# Patient Record
Sex: Female | Born: 1960 | Race: White | Hispanic: No | Marital: Married | Smoking: Never smoker
Health system: Southern US, Community
[De-identification: ages and names within clinical notes are randomized; demographics above are authoritative.]

## PROBLEM LIST (undated history)

## (undated) DIAGNOSIS — Q782 Osteopetrosis: Secondary | ICD-10-CM

## (undated) DIAGNOSIS — E079 Disorder of thyroid, unspecified: Secondary | ICD-10-CM

---

## 2001-02-13 ENCOUNTER — Other Ambulatory Visit: Admission: RE | Admit: 2001-02-13 | Discharge: 2001-02-13 | Payer: Self-pay | Admitting: Obstetrics and Gynecology

## 2001-02-17 ENCOUNTER — Encounter: Admission: RE | Admit: 2001-02-17 | Discharge: 2001-02-17 | Payer: Self-pay | Admitting: Obstetrics and Gynecology

## 2001-02-17 ENCOUNTER — Encounter: Payer: Self-pay | Admitting: Obstetrics and Gynecology

## 2002-02-22 ENCOUNTER — Other Ambulatory Visit: Admission: RE | Admit: 2002-02-22 | Discharge: 2002-02-22 | Payer: Self-pay | Admitting: Obstetrics and Gynecology

## 2003-04-06 ENCOUNTER — Other Ambulatory Visit: Admission: RE | Admit: 2003-04-06 | Discharge: 2003-04-06 | Payer: Self-pay | Admitting: Obstetrics and Gynecology

## 2003-04-08 ENCOUNTER — Encounter: Payer: Self-pay | Admitting: Obstetrics and Gynecology

## 2003-04-08 ENCOUNTER — Ambulatory Visit (HOSPITAL_COMMUNITY): Admission: RE | Admit: 2003-04-08 | Discharge: 2003-04-08 | Payer: Self-pay | Admitting: Obstetrics and Gynecology

## 2003-07-19 ENCOUNTER — Ambulatory Visit (HOSPITAL_COMMUNITY): Admission: RE | Admit: 2003-07-19 | Discharge: 2003-07-19 | Payer: Self-pay | Admitting: Family Medicine

## 2003-07-19 ENCOUNTER — Encounter: Payer: Self-pay | Admitting: Family Medicine

## 2004-06-13 ENCOUNTER — Other Ambulatory Visit: Admission: RE | Admit: 2004-06-13 | Discharge: 2004-06-13 | Payer: Self-pay | Admitting: Obstetrics and Gynecology

## 2005-09-05 ENCOUNTER — Other Ambulatory Visit: Admission: RE | Admit: 2005-09-05 | Discharge: 2005-09-05 | Payer: Self-pay | Admitting: Obstetrics and Gynecology

## 2005-09-11 ENCOUNTER — Encounter: Admission: RE | Admit: 2005-09-11 | Discharge: 2005-09-11 | Payer: Self-pay | Admitting: Obstetrics and Gynecology

## 2005-12-10 ENCOUNTER — Other Ambulatory Visit: Admission: RE | Admit: 2005-12-10 | Discharge: 2005-12-10 | Payer: Self-pay | Admitting: Obstetrics and Gynecology

## 2006-04-15 ENCOUNTER — Encounter: Admission: RE | Admit: 2006-04-15 | Discharge: 2006-04-15 | Payer: Self-pay | Admitting: Obstetrics and Gynecology

## 2006-09-10 ENCOUNTER — Encounter: Admission: RE | Admit: 2006-09-10 | Discharge: 2006-09-10 | Payer: Self-pay | Admitting: Obstetrics and Gynecology

## 2007-10-14 ENCOUNTER — Encounter: Admission: RE | Admit: 2007-10-14 | Discharge: 2007-10-14 | Payer: Self-pay | Admitting: Obstetrics and Gynecology

## 2009-01-20 ENCOUNTER — Encounter: Admission: RE | Admit: 2009-01-20 | Discharge: 2009-01-20 | Payer: Self-pay | Admitting: Obstetrics and Gynecology

## 2010-02-16 ENCOUNTER — Encounter: Admission: RE | Admit: 2010-02-16 | Discharge: 2010-02-16 | Payer: Self-pay | Admitting: Obstetrics and Gynecology

## 2010-10-27 ENCOUNTER — Encounter: Payer: Self-pay | Admitting: Obstetrics and Gynecology

## 2011-02-27 ENCOUNTER — Other Ambulatory Visit: Payer: Self-pay | Admitting: Obstetrics and Gynecology

## 2011-02-27 DIAGNOSIS — Z1231 Encounter for screening mammogram for malignant neoplasm of breast: Secondary | ICD-10-CM

## 2011-03-22 ENCOUNTER — Ambulatory Visit
Admission: RE | Admit: 2011-03-22 | Discharge: 2011-03-22 | Disposition: A | Discharge status: Home / Self Care | Payer: BC Managed Care – PPO | Source: Ambulatory Visit | Attending: Obstetrics and Gynecology | Admitting: Obstetrics and Gynecology

## 2011-03-22 DIAGNOSIS — Z1231 Encounter for screening mammogram for malignant neoplasm of breast: Secondary | ICD-10-CM

## 2012-02-11 ENCOUNTER — Other Ambulatory Visit: Payer: Self-pay | Admitting: Obstetrics and Gynecology

## 2012-02-11 DIAGNOSIS — Z1231 Encounter for screening mammogram for malignant neoplasm of breast: Secondary | ICD-10-CM

## 2012-03-27 ENCOUNTER — Ambulatory Visit
Admission: RE | Admit: 2012-03-27 | Discharge: 2012-03-27 | Disposition: A | Discharge status: Home / Self Care | Payer: BC Managed Care – PPO | Source: Ambulatory Visit | Attending: Obstetrics and Gynecology | Admitting: Obstetrics and Gynecology

## 2012-03-27 DIAGNOSIS — Z1231 Encounter for screening mammogram for malignant neoplasm of breast: Secondary | ICD-10-CM

## 2013-04-06 ENCOUNTER — Other Ambulatory Visit: Payer: Self-pay

## 2013-04-06 DIAGNOSIS — Z1231 Encounter for screening mammogram for malignant neoplasm of breast: Secondary | ICD-10-CM

## 2013-04-23 ENCOUNTER — Ambulatory Visit
Admission: RE | Admit: 2013-04-23 | Discharge: 2013-04-23 | Disposition: A | Discharge status: Home / Self Care | Payer: BC Managed Care – PPO | Source: Ambulatory Visit

## 2013-04-23 DIAGNOSIS — Z1231 Encounter for screening mammogram for malignant neoplasm of breast: Secondary | ICD-10-CM

## 2013-04-27 ENCOUNTER — Other Ambulatory Visit: Payer: Self-pay | Admitting: Obstetrics and Gynecology

## 2013-04-27 DIAGNOSIS — R928 Other abnormal and inconclusive findings on diagnostic imaging of breast: Secondary | ICD-10-CM

## 2013-05-17 ENCOUNTER — Ambulatory Visit
Admission: RE | Admit: 2013-05-17 | Discharge: 2013-05-17 | Disposition: A | Discharge status: Home / Self Care | Payer: BC Managed Care – PPO | Source: Ambulatory Visit | Attending: Obstetrics and Gynecology | Admitting: Obstetrics and Gynecology

## 2013-05-17 DIAGNOSIS — R928 Other abnormal and inconclusive findings on diagnostic imaging of breast: Secondary | ICD-10-CM

## 2014-03-17 ENCOUNTER — Other Ambulatory Visit: Payer: Self-pay | Admitting: Obstetrics and Gynecology

## 2014-03-17 DIAGNOSIS — N6002 Solitary cyst of left breast: Secondary | ICD-10-CM

## 2014-04-29 ENCOUNTER — Ambulatory Visit
Admission: RE | Admit: 2014-04-29 | Discharge: 2014-04-29 | Disposition: A | Discharge status: Home / Self Care | Payer: BC Managed Care – PPO | Source: Ambulatory Visit | Attending: Obstetrics and Gynecology | Admitting: Obstetrics and Gynecology

## 2014-04-29 DIAGNOSIS — N6002 Solitary cyst of left breast: Secondary | ICD-10-CM

## 2015-06-02 ENCOUNTER — Other Ambulatory Visit: Payer: Self-pay

## 2015-06-02 DIAGNOSIS — Z1231 Encounter for screening mammogram for malignant neoplasm of breast: Secondary | ICD-10-CM

## 2015-07-14 ENCOUNTER — Ambulatory Visit
Admission: RE | Admit: 2015-07-14 | Discharge: 2015-07-14 | Disposition: A | Discharge status: Home / Self Care | Payer: BC Managed Care – PPO | Source: Ambulatory Visit

## 2015-07-14 DIAGNOSIS — Z1231 Encounter for screening mammogram for malignant neoplasm of breast: Secondary | ICD-10-CM

## 2016-07-24 ENCOUNTER — Other Ambulatory Visit: Payer: Self-pay | Admitting: Obstetrics and Gynecology

## 2016-07-24 DIAGNOSIS — Z1231 Encounter for screening mammogram for malignant neoplasm of breast: Secondary | ICD-10-CM

## 2016-08-16 ENCOUNTER — Ambulatory Visit
Admission: RE | Admit: 2016-08-16 | Discharge: 2016-08-16 | Disposition: A | Discharge status: Home / Self Care | Payer: BC Managed Care – PPO | Source: Ambulatory Visit | Attending: Obstetrics and Gynecology | Admitting: Obstetrics and Gynecology

## 2016-08-16 DIAGNOSIS — Z1231 Encounter for screening mammogram for malignant neoplasm of breast: Secondary | ICD-10-CM

## 2017-07-22 ENCOUNTER — Other Ambulatory Visit: Payer: Self-pay | Admitting: Obstetrics and Gynecology

## 2017-07-22 DIAGNOSIS — Z1231 Encounter for screening mammogram for malignant neoplasm of breast: Secondary | ICD-10-CM

## 2017-08-18 ENCOUNTER — Ambulatory Visit
Admission: RE | Admit: 2017-08-18 | Discharge: 2017-08-18 | Disposition: A | Discharge status: Home / Self Care | Payer: BC Managed Care – PPO | Source: Ambulatory Visit | Attending: Obstetrics and Gynecology | Admitting: Obstetrics and Gynecology

## 2017-08-18 DIAGNOSIS — Z1231 Encounter for screening mammogram for malignant neoplasm of breast: Secondary | ICD-10-CM

## 2018-08-07 ENCOUNTER — Other Ambulatory Visit: Payer: Self-pay | Admitting: Obstetrics and Gynecology

## 2018-08-07 DIAGNOSIS — Z1231 Encounter for screening mammogram for malignant neoplasm of breast: Secondary | ICD-10-CM

## 2018-09-21 ENCOUNTER — Ambulatory Visit
Admission: RE | Admit: 2018-09-21 | Discharge: 2018-09-21 | Disposition: A | Discharge status: Home / Self Care | Payer: BC Managed Care – PPO | Source: Ambulatory Visit | Attending: Obstetrics and Gynecology | Admitting: Obstetrics and Gynecology

## 2018-09-21 DIAGNOSIS — Z1231 Encounter for screening mammogram for malignant neoplasm of breast: Secondary | ICD-10-CM

## 2019-07-14 ENCOUNTER — Other Ambulatory Visit: Payer: Self-pay | Admitting: Obstetrics and Gynecology

## 2019-07-14 DIAGNOSIS — Z1231 Encounter for screening mammogram for malignant neoplasm of breast: Secondary | ICD-10-CM

## 2019-09-09 ENCOUNTER — Other Ambulatory Visit: Payer: Self-pay

## 2019-09-09 ENCOUNTER — Ambulatory Visit (INDEPENDENT_AMBULATORY_CARE_PROVIDER_SITE_OTHER): Payer: BC Managed Care – PPO

## 2019-09-09 ENCOUNTER — Ambulatory Visit
Admission: EM | Admit: 2019-09-09 | Discharge: 2019-09-09 | Disposition: A | Discharge status: Home / Self Care | Payer: BC Managed Care – PPO

## 2019-09-09 DIAGNOSIS — R0789 Other chest pain: Secondary | ICD-10-CM | POA: Diagnosis not present

## 2019-09-09 DIAGNOSIS — S0992XA Unspecified injury of nose, initial encounter: Secondary | ICD-10-CM

## 2019-09-09 DIAGNOSIS — S00511A Abrasion of lip, initial encounter: Secondary | ICD-10-CM

## 2019-09-09 DIAGNOSIS — J3489 Other specified disorders of nose and nasal sinuses: Secondary | ICD-10-CM

## 2019-09-09 MED ORDER — CYCLOBENZAPRINE HCL 5 MG PO TABS: 5.0000 mg | ORAL_TABLET | Freq: Every day | ORAL | 0 refills | Status: DC

## 2019-09-09 MED ORDER — NAPROXEN 375 MG PO TABS: 375.0000 mg | ORAL_TABLET | Freq: Two times a day (BID) | ORAL | 0 refills | Status: DC

## 2019-09-09 NOTE — Discharge Instructions (Signed)
X-rays negative for fracture or dislocation Rest, ice and heat as needed Ensure adequate range of motion as tolerated. Injuries all appear to be muscular in nature at this time Prescribed naproxen as needed for inflammation and pain relief.  DO NOT TAKE WITH OTHER antiinflammatories, as this may cause GI upset and/or bleed Prescribed flexeril as needed at bedtime for muscle spasm.  Do not drive or operate heavy machinery while taking this medication Expect some increased pain in the next 1-3 days.  It may take 3-4 weeks for complete resolution of symptoms Will f/u with her doctor or here if not seeing significant improvement within one week. Return here or go to ER if you have any new or worsening symptoms such as numbness/tingling of the inner thighs, loss of bladder or bowel control, headache/blurry vision, nausea/vomiting, confusion/altered mental status, dizziness, weakness, passing out, imbalance, etc... 
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
## 2019-09-28 ENCOUNTER — Other Ambulatory Visit: Payer: Self-pay

## 2019-09-28 ENCOUNTER — Ambulatory Visit
Admission: RE | Admit: 2019-09-28 | Discharge: 2019-09-28 | Disposition: A | Discharge status: Home / Self Care | Payer: BC Managed Care – PPO | Source: Ambulatory Visit | Attending: Obstetrics and Gynecology | Admitting: Obstetrics and Gynecology

## 2019-09-28 DIAGNOSIS — Z1231 Encounter for screening mammogram for malignant neoplasm of breast: Secondary | ICD-10-CM

## 2020-08-17 ENCOUNTER — Other Ambulatory Visit: Payer: Self-pay | Admitting: Obstetrics and Gynecology

## 2020-08-17 DIAGNOSIS — Z1231 Encounter for screening mammogram for malignant neoplasm of breast: Secondary | ICD-10-CM

## 2020-10-04 ENCOUNTER — Ambulatory Visit: Payer: BC Managed Care – PPO

## 2020-11-10 ENCOUNTER — Ambulatory Visit
Admission: RE | Admit: 2020-11-10 | Discharge: 2020-11-10 | Disposition: A | Discharge status: Home / Self Care | Payer: Self-pay | Source: Ambulatory Visit | Attending: Obstetrics and Gynecology | Admitting: Obstetrics and Gynecology

## 2020-11-10 ENCOUNTER — Other Ambulatory Visit: Payer: Self-pay

## 2020-11-10 DIAGNOSIS — Z1231 Encounter for screening mammogram for malignant neoplasm of breast: Secondary | ICD-10-CM

## 2021-08-07 ENCOUNTER — Other Ambulatory Visit: Payer: Self-pay | Admitting: Obstetrics and Gynecology

## 2021-08-07 DIAGNOSIS — Z1231 Encounter for screening mammogram for malignant neoplasm of breast: Secondary | ICD-10-CM

## 2021-11-29 ENCOUNTER — Other Ambulatory Visit: Payer: Self-pay

## 2021-11-29 ENCOUNTER — Ambulatory Visit
Admission: RE | Admit: 2021-11-29 | Discharge: 2021-11-29 | Disposition: A | Discharge status: Home / Self Care | Payer: BLUE CROSS/BLUE SHIELD | Source: Ambulatory Visit | Attending: Obstetrics and Gynecology | Admitting: Obstetrics and Gynecology

## 2021-11-29 DIAGNOSIS — Z1231 Encounter for screening mammogram for malignant neoplasm of breast: Secondary | ICD-10-CM

## 2021-12-25 ENCOUNTER — Other Ambulatory Visit: Payer: Self-pay | Admitting: Obstetrics and Gynecology

## 2021-12-25 DIAGNOSIS — N6320 Unspecified lump in the left breast, unspecified quadrant: Secondary | ICD-10-CM

## 2022-01-10 ENCOUNTER — Ambulatory Visit
Admission: RE | Admit: 2022-01-10 | Discharge: 2022-01-10 | Disposition: A | Discharge status: Home / Self Care | Payer: BLUE CROSS/BLUE SHIELD | Source: Ambulatory Visit | Attending: Obstetrics and Gynecology | Admitting: Obstetrics and Gynecology

## 2022-01-10 DIAGNOSIS — N6320 Unspecified lump in the left breast, unspecified quadrant: Secondary | ICD-10-CM

## 2023-08-02 ENCOUNTER — Ambulatory Visit
Admission: EM | Admit: 2023-08-02 | Discharge: 2023-08-02 | Disposition: A | Discharge status: Home / Self Care | Payer: BC Managed Care – PPO

## 2023-08-02 ENCOUNTER — Encounter: Payer: Self-pay | Admitting: Emergency Medicine

## 2023-08-02 DIAGNOSIS — J301 Allergic rhinitis due to pollen: Secondary | ICD-10-CM

## 2023-08-02 HISTORY — DX: Disorder of thyroid, unspecified: E07.9

## 2023-08-02 HISTORY — DX: Osteopetrosis: Q78.2

## 2023-08-02 LAB — POCT RAPID STREP A (OFFICE): Rapid Strep A Screen: NEGATIVE

## 2023-08-02 MED ORDER — BENZONATATE 100 MG PO CAPS: 100.0000 mg | ORAL_CAPSULE | Freq: Three times a day (TID) | ORAL | 0 refills | Status: AC

## 2023-08-02 NOTE — ED Triage Notes (Signed)
Sore throat since Tuesday.  Headache that comes and goes.  Occasional cough.

## 2023-08-02 NOTE — ED Triage Notes (Signed)
Home covid test this morning was negative

## 2023-08-02 NOTE — Discharge Instructions (Signed)
Your symptoms are likely due to environmental allergies.  Avoid exposure to allergens. Take oral antihistamine (Either Zyrtec, Claritin, or Allegra) and use Flonase daily as directed. You can buy these medications over the counter. These medications can take a few days to fully kick in to your body and start working. Return for any new or worsening symptoms.  If your eyes become itchy, you may purchase olopatadine (Pataday) eyedrops over the counter and use as directed to relieve watery/itchy eyes associated with allergies as well.   If your symptoms are severe, please go to the emergency room for further evaluation. Schedule an appointment with your primary care provider for follow-up and further management of your seasonal allergies as well as ongoing preventive healthcare.

## 2023-08-02 NOTE — ED Provider Notes (Signed)
[facility]    CSN: [id number] Arrival date & time: 08/02/23  1058      History   Chief Complaint Chief Complaint  Patient presents with   Sore Throat    HPI Linda Kim is a 62 y.o. female.   Linda Kim is a 62 y.o. female presenting for chief complaint of Sore Throat that started 4 to 5 days ago with associated intermittent generalized headache and throat clearing cough.  Cough is nonproductive and she states it is triggered by throat clearing sensation to the back of her throat.  Throat pain is worsened by swallowing and sometimes improves after ibuprofen or Tylenol use.  Fevers, chills, body aches, ear pain, sneezing, watery/itchy eyes, rash, nausea, vomiting, diarrhea, abdominal pain.  She does not have a history of seasonal allergies and does not currently take any medications for allergies.  No history of chronic respiratory problems.  No recent sick contacts with similar symptoms.  Never smoker, denies drug use.  Has been taking Tylenol and ibuprofen over-the-counter with with some temporary relief.   Sore Throat    Past Medical History:  Diagnosis Date   Osteopetrosis    Thyroid disease     There are no problems to display for this patient.   History reviewed. No pertinent surgical history.  OB History   No obstetric history on file.      Home Medications    Prior to Admission medications   Medication Sig Start Date End Date Taking? Authorizing Provider  benzonatate (TESSALON) 100 MG capsule Take 1 capsule (100 mg total) by mouth every 8 (eight) hours. 08/02/23  Yes Carlisle Beers, FNP  denosumab (PROLIA) 60 MG/ML SOSY injection Inject 60 mg into the skin every 6 (six) months.   Yes [provider]  calcium carbonate (OS-CAL - DOSED IN MG OF ELEMENTAL CALCIUM) 1250 (500 Ca) MG tablet Take 1 tablet by mouth.    [provider]  cholecalciferol (VITAMIN D3) 25 MCG (1000 UT) tablet Take 1,000 Units by mouth daily.     [provider]  levothyroxine (SYNTHROID) 50 MCG tablet Take 50 mcg by mouth daily before breakfast.    [provider]    Family History Family History  Problem Relation Age of Onset   Breast cancer Maternal Aunt        unsure of age   Healthy Mother     Social History Social History   Tobacco Use   Smoking status: Never   Smokeless tobacco: Never  Vaping Use   Vaping status: Never Used  Substance Use Topics   Alcohol use: Never   Drug use: Never     Allergies   Fosamax [alendronate]   Review of Systems Review of Systems Per HPI  Physical Exam Triage Vital Signs ED Triage Vitals [08/02/23 1202]  Encounter Vitals Group     BP (!) 124/55     Systolic BP Percentile      Diastolic BP Percentile      Pulse Rate 76     Resp 18     Temp 98.3 F (36.8 C)     Temp Source Oral     SpO2 96 %     Weight      Height      Head Circumference      Peak Flow      Pain Score 8     Pain Loc      Pain Education  Exclude from Growth Chart    No data found.  Updated Vital Signs BP (!) 124/55 (BP Location: Right Arm)   Pulse 76   Temp 98.3 F (36.8 C) (Oral)   Resp 18   SpO2 96%   Visual Acuity Right Eye Distance:   Left Eye Distance:   Bilateral Distance:    Right Eye Near:   Left Eye Near:    Bilateral Near:     Physical Exam Vitals and nursing note reviewed.  Constitutional:      Appearance: She is not ill-appearing or toxic-appearing.  HENT:     Head: Normocephalic and atraumatic.     Right Ear: Hearing, tympanic membrane, ear canal and external ear normal.     Left Ear: Hearing, tympanic membrane, ear canal and external ear normal.     Nose: Nose normal.     Mouth/Throat:     Lips: Pink.     Mouth: Mucous membranes are moist. No injury.     Tongue: No lesions. Tongue does not deviate from midline.     Palate: No mass and lesions.     Pharynx: Oropharynx is clear. Uvula midline. Posterior oropharyngeal erythema present. No  pharyngeal swelling, oropharyngeal exudate or uvula swelling.     Tonsils: No tonsillar exudate or tonsillar abscesses.     Comments: Mild erythema to posterior oropharynx with small amount of clear postnasal drainage visualized. Eyes:     General: Lids are normal. Vision grossly intact. Gaze aligned appropriately.     Extraocular Movements: Extraocular movements intact.     Conjunctiva/sclera: Conjunctivae normal.  Cardiovascular:     Rate and Rhythm: Normal rate and regular rhythm.     Heart sounds: Normal heart sounds, S1 normal and S2 normal.  Pulmonary:     Effort: Pulmonary effort is normal. No respiratory distress.     Breath sounds: Normal breath sounds and air entry.  Musculoskeletal:     Cervical back: Neck supple.  Skin:    General: Skin is warm and dry.     Capillary Refill: Capillary refill takes less than 2 seconds.     Findings: No rash.  Neurological:     General: No focal deficit present.     Mental Status: She is alert and oriented to person, place, and time. Mental status is at baseline.     Cranial Nerves: No dysarthria or facial asymmetry.  Psychiatric:        Mood and Affect: Mood normal.        Speech: Speech normal.        Behavior: Behavior normal.        Thought Content: Thought content normal.        Judgment: Judgment normal.      UC Treatments / Results  Labs (all labs ordered are listed, but only abnormal results are displayed) Labs Reviewed  POCT RAPID STREP A (OFFICE)    EKG   Radiology No results found.  Procedures Procedures (including critical care time)  Medications Ordered in UC Medications - No data to display  Initial Impression / Assessment and Plan / UC Course  I have reviewed the triage vital signs and the nursing notes.  Pertinent labs & imaging results that were available during my care of the patient were reviewed by me and considered in my medical decision making (see chart for details).   1. Seasonal allergic  rhinitis Allergic rhinitis will be treated with daily antihistamine and Flonase nasal spray. May use tessalon perles for  cough. Low suspicion for viral URI etiology, no indication for viral testing. Lungs clear, therefore deferred imaging. Vital signs stable.  Counseled patient on potential for adverse effects with medications prescribed/recommended today, strict ER and return-to-clinic precautions discussed, patient verbalized understanding.    Final Clinical Impressions(s) / UC Diagnoses   Final diagnoses:  Seasonal allergic rhinitis due to pollen     Discharge Instructions      Your symptoms are likely due to environmental allergies.  Avoid exposure to allergens. Take oral antihistamine (Either Zyrtec, Claritin, or Allegra) and use Flonase daily as directed. You can buy these medications over the counter. These medications can take a few days to fully kick in to your body and start working. Return for any new or worsening symptoms.  If your eyes become itchy, you may purchase olopatadine (Pataday) eyedrops over the counter and use as directed to relieve watery/itchy eyes associated with allergies as well.   If your symptoms are severe, please go to the emergency room for further evaluation. Schedule an appointment with your primary care provider for follow-up and further management of your seasonal allergies as well as ongoing preventive healthcare.    ED Prescriptions     Medication Sig Dispense Auth. Provider   benzonatate (TESSALON) 100 MG capsule Take 1 capsule (100 mg total) by mouth every 8 (eight) hours. 21 capsule Carlisle Beers, FNP      PDMP not reviewed this encounter.   Carlisle Beers, [place] 08/02/23 1238

## 2023-11-01 IMAGING — US US BREAST*L* LIMITED INC AXILLA
1 series · 2 of 2 positions shown · non-contrast
Comparison: 11/29/2021 and earlier

CLINICAL DATA: Palpable mass noted in the 6 o'clock location on
recent physical exam by provider.

EXAM:
DIGITAL DIAGNOSTIC UNILATERAL LEFT MAMMOGRAM WITH TOMOSYNTHESIS AND
CAD; ULTRASOUND LEFT BREAST LIMITED
TECHNIQUE: Left digital diagnostic mammography and breast tomosynthesis was
performed. The images were evaluated with computer-aided detection.;
Targeted ultrasound examination of the left breast was performed.

[Series 1: us breast*left* limited inc axilla · 0.09mm/px · 2 of 2 slices shown]
[im 1/2]
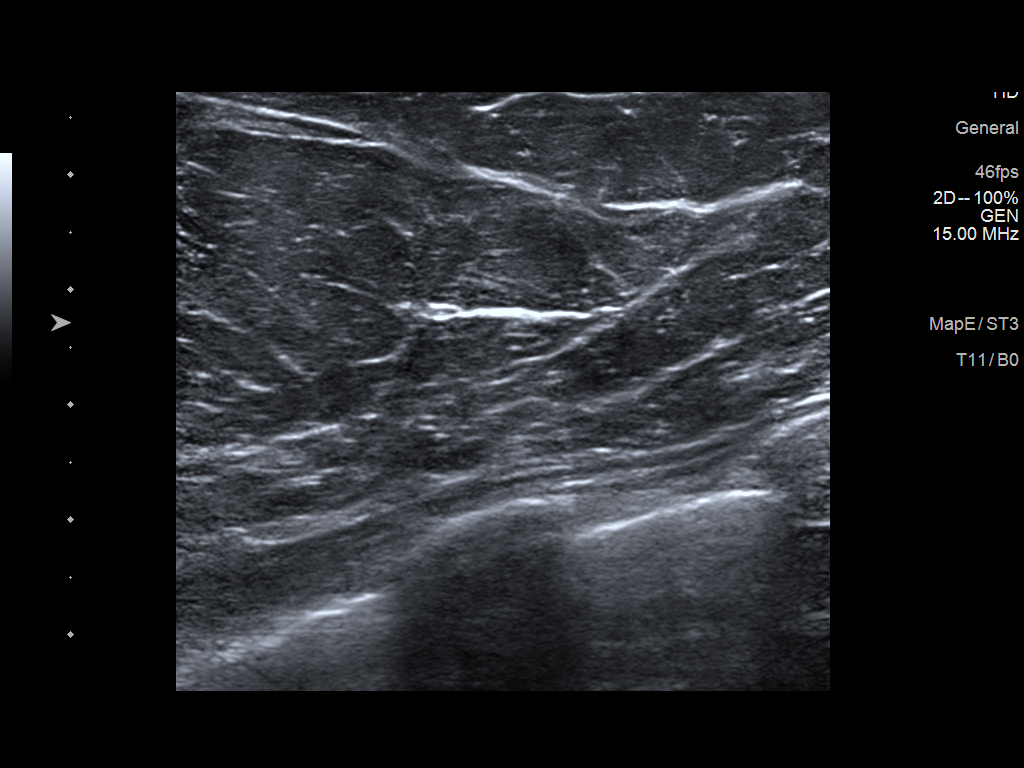
[im 2/2]
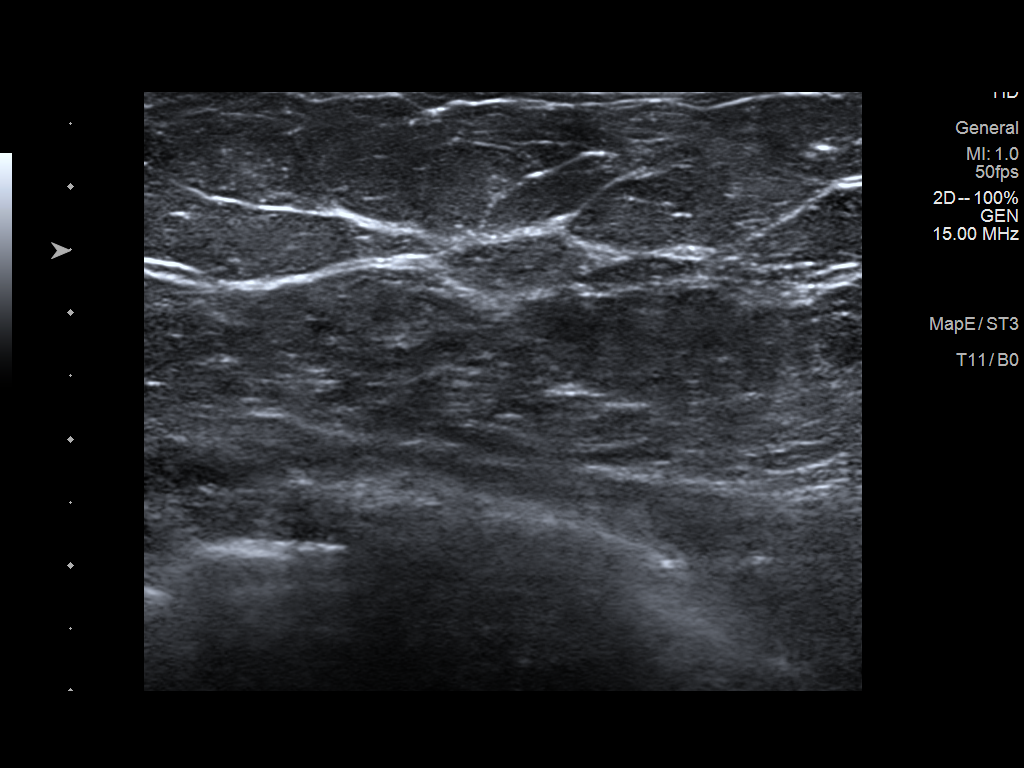

[2 of 2 positions shown; findings below may reference images not displayed]

ACR Breast Density Category b: There are scattered areas of
fibroglandular density.
FINDINGS: No suspicious mass, distortion, or microcalcifications are
identified to suggest presence of malignancy.

On physical exam, I palpate soft nodularity along the inframammary
fold of the LEFT breast. I palpate no discrete mass in this region.

Targeted ultrasound is performed, showing normal appearing
fibrofatty tissue throughout the inframammary fold region of the
LEFT breast. No suspicious mass, distortion, or acoustic shadowing
is demonstrated with ultrasound.
IMPRESSION: No mammographic or ultrasound evidence for malignancy.

RECOMMENDATION:
Screening mammogram is recommended in November 2022.

I have discussed the findings and recommendations with the patient.
If applicable, a reminder letter will be sent to the patient
regarding the next appointment.

BI-RADS CATEGORY  1: Negative.

## 2023-11-01 IMAGING — MG MM DIGITAL DIAGNOSTIC UNILAT*L* W/ TOMO W/ CAD
4 series · 4 of 12 positions shown · non-contrast
Comparison: 11/29/2021 and earlier

CLINICAL DATA: Palpable mass noted in the 6 o'clock location on
recent physical exam by provider.

EXAM:
DIGITAL DIAGNOSTIC UNILATERAL LEFT MAMMOGRAM WITH TOMOSYNTHESIS AND
CAD; ULTRASOUND LEFT BREAST LIMITED
TECHNIQUE: Left digital diagnostic mammography and breast tomosynthesis was
performed. The images were evaluated with computer-aided detection.;
Targeted ultrasound examination of the left breast was performed.

[L CC synth-2D]
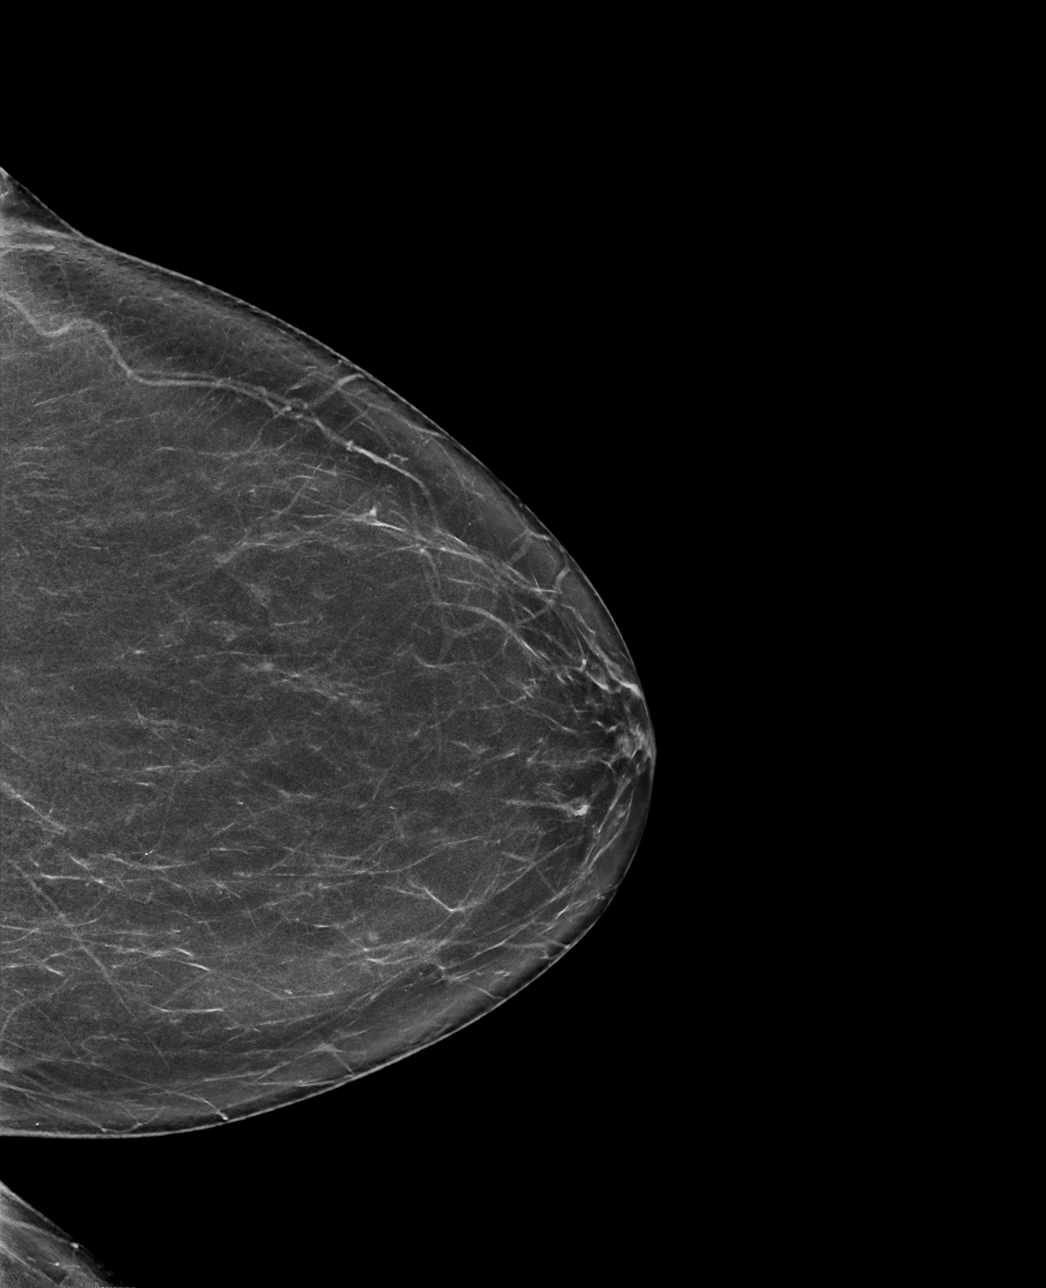

[L MLO synth-2D]
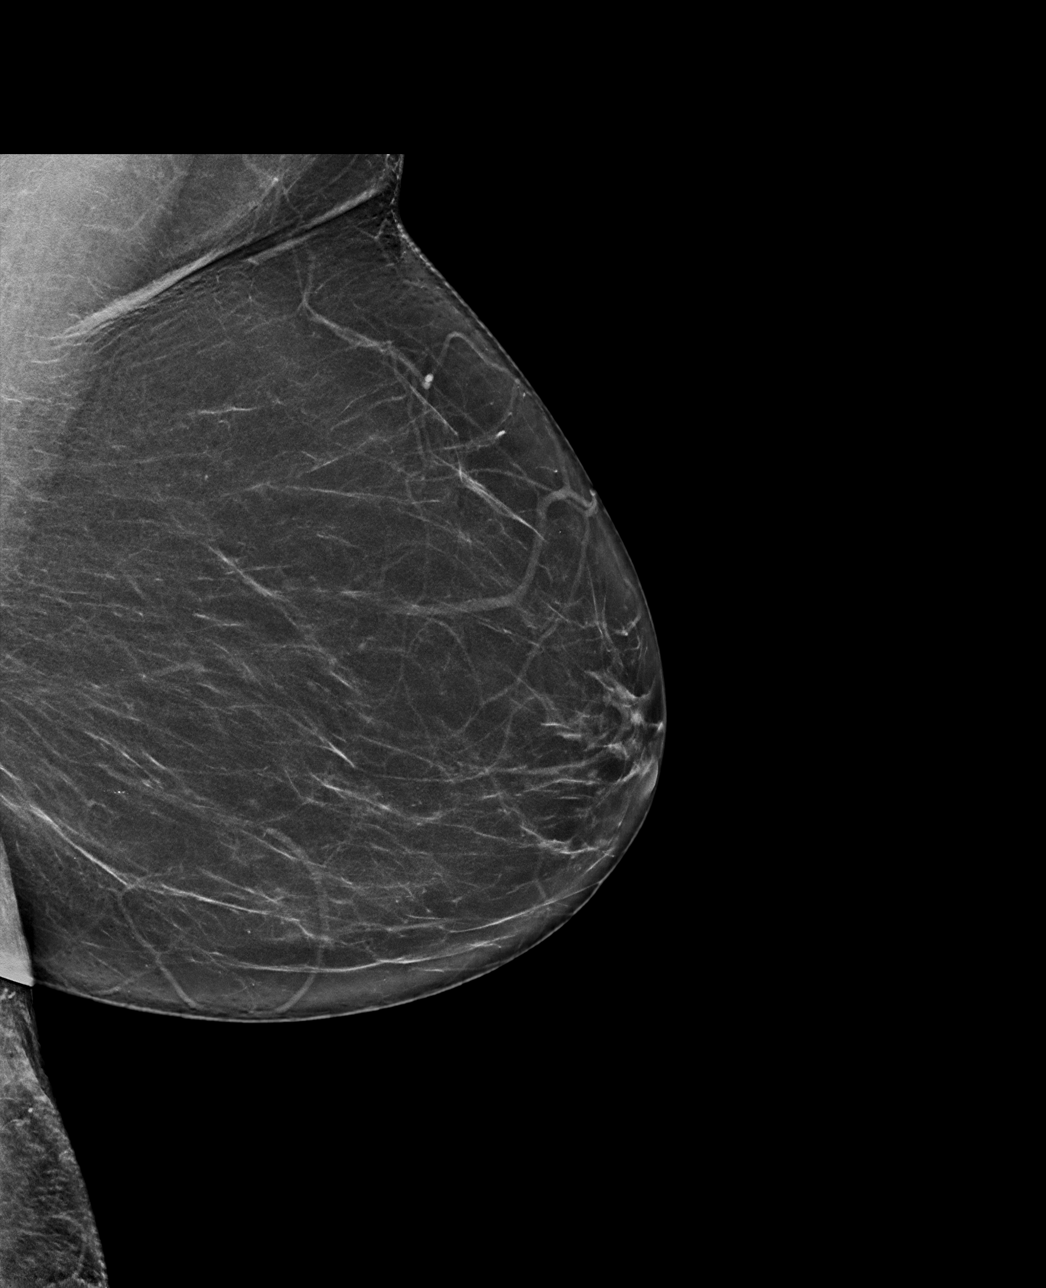

[L MLO tomo · tomo slice 35/70.0]
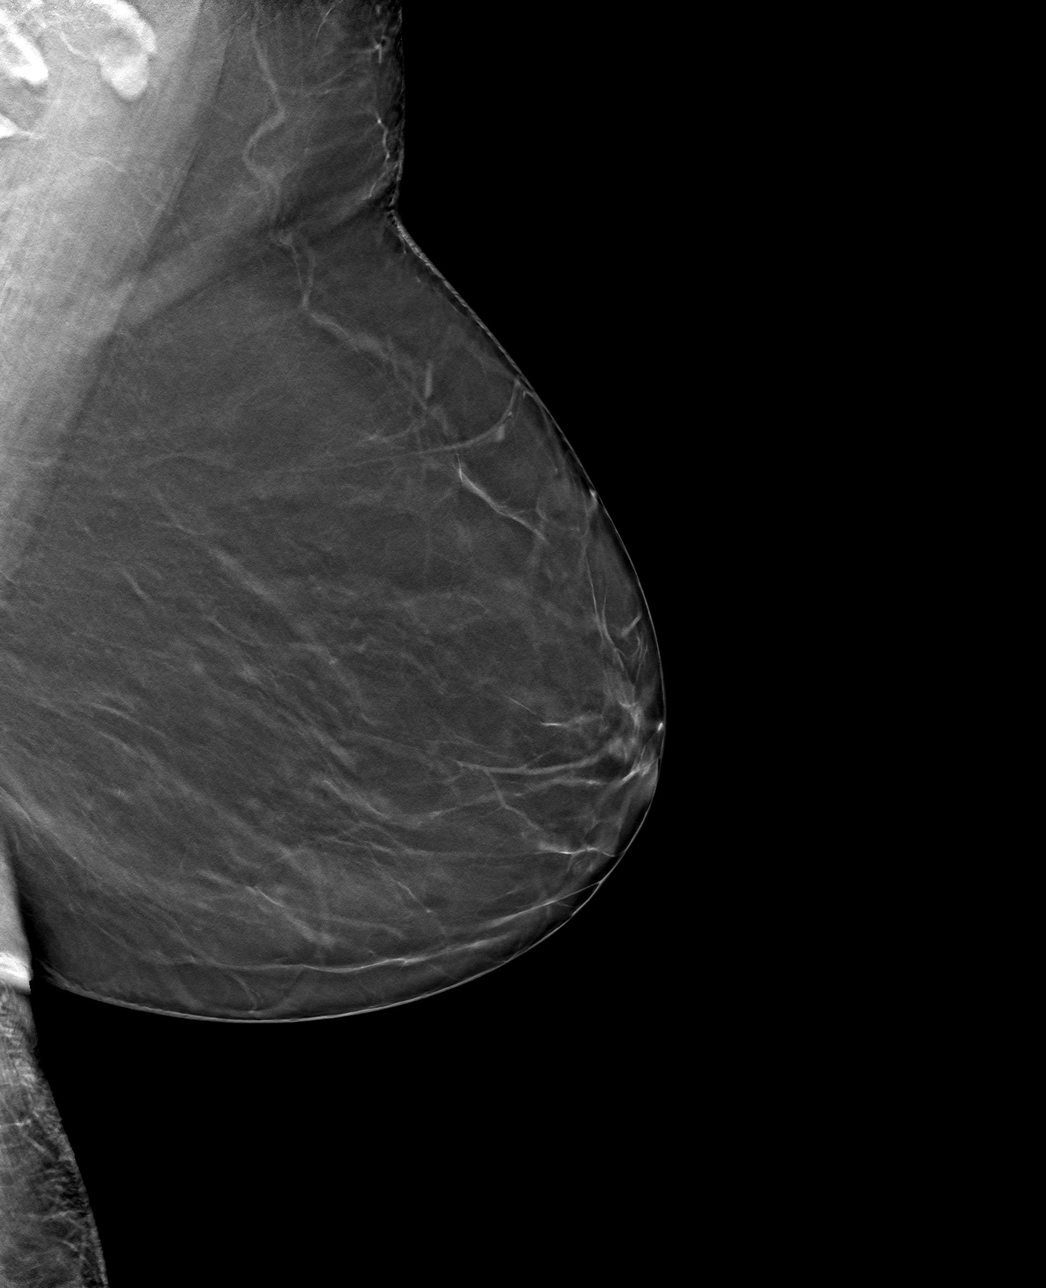

[L CC tomo · tomo slice 35/70.0]
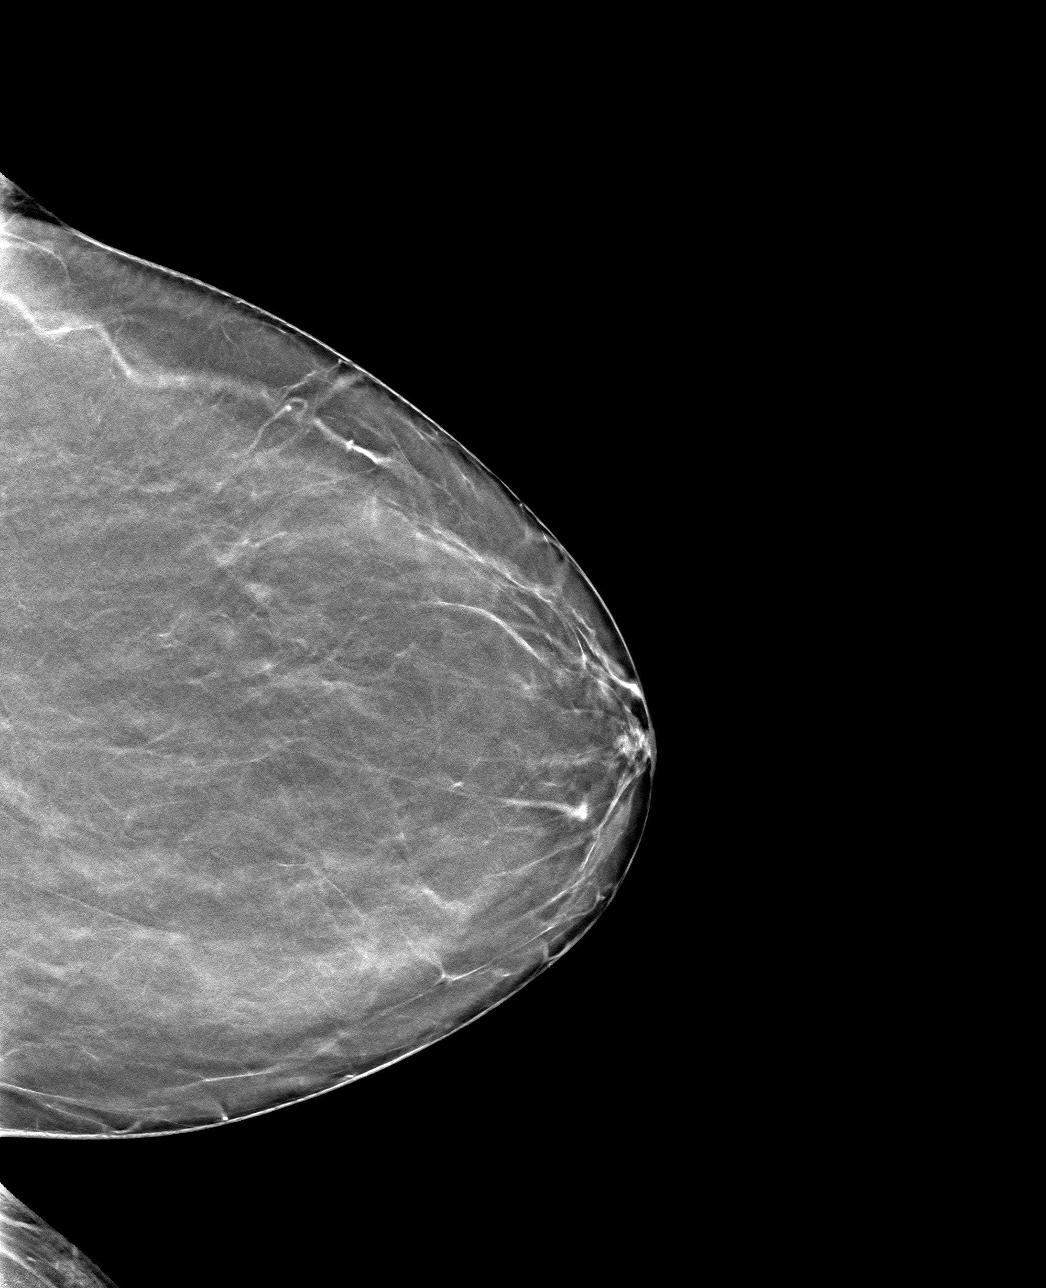

[4 of 12 positions shown; findings below may reference images not displayed]

ACR Breast Density Category b: There are scattered areas of
fibroglandular density.
FINDINGS: No suspicious mass, distortion, or microcalcifications are
identified to suggest presence of malignancy.

On physical exam, I palpate soft nodularity along the inframammary
fold of the LEFT breast. I palpate no discrete mass in this region.

Targeted ultrasound is performed, showing normal appearing
fibrofatty tissue throughout the inframammary fold region of the
LEFT breast. No suspicious mass, distortion, or acoustic shadowing
is demonstrated with ultrasound.
IMPRESSION: No mammographic or ultrasound evidence for malignancy.

RECOMMENDATION:
Screening mammogram is recommended in November 2022.

I have discussed the findings and recommendations with the patient.
If applicable, a reminder letter will be sent to the patient
regarding the next appointment.

BI-RADS CATEGORY  1: Negative.

## 2023-12-04 ENCOUNTER — Other Ambulatory Visit: Payer: Self-pay | Admitting: Obstetrics and Gynecology

## 2023-12-04 DIAGNOSIS — Z1231 Encounter for screening mammogram for malignant neoplasm of breast: Secondary | ICD-10-CM

## 2023-12-11 ENCOUNTER — Ambulatory Visit
Admission: RE | Admit: 2023-12-11 | Discharge: 2023-12-11 | Disposition: A | Discharge status: Home / Self Care | Payer: BC Managed Care – PPO | Source: Ambulatory Visit | Attending: Obstetrics and Gynecology | Admitting: Obstetrics and Gynecology

## 2023-12-11 DIAGNOSIS — Z1231 Encounter for screening mammogram for malignant neoplasm of breast: Secondary | ICD-10-CM

## 2024-03-10 ENCOUNTER — Other Ambulatory Visit (HOSPITAL_COMMUNITY): Payer: Self-pay | Admitting: Obstetrics and Gynecology

## 2024-03-10 DIAGNOSIS — E7800 Pure hypercholesterolemia, unspecified: Secondary | ICD-10-CM

## 2024-03-26 ENCOUNTER — Ambulatory Visit (HOSPITAL_COMMUNITY)
Admission: RE | Admit: 2024-03-26 | Discharge: 2024-03-26 | Disposition: A | Discharge status: Home / Self Care | Source: Ambulatory Visit | Attending: Obstetrics and Gynecology | Admitting: Obstetrics and Gynecology

## 2024-03-26 ENCOUNTER — Encounter (HOSPITAL_COMMUNITY): Payer: Self-pay

## 2024-03-26 DIAGNOSIS — E7800 Pure hypercholesterolemia, unspecified: Secondary | ICD-10-CM | POA: Insufficient documentation

## 2024-04-02 ENCOUNTER — Encounter: Payer: Self-pay | Admitting: Internal Medicine

## 2024-04-02 ENCOUNTER — Ambulatory Visit: Attending: Internal Medicine | Admitting: Internal Medicine

## 2024-04-02 VITALS — BP 114/62 | HR 62 | Ht 61.0 in | Wt 141.4 lb

## 2024-04-02 DIAGNOSIS — I515 Myocardial degeneration: Secondary | ICD-10-CM | POA: Diagnosis not present

## 2024-04-02 DIAGNOSIS — Z131 Encounter for screening for diabetes mellitus: Secondary | ICD-10-CM

## 2024-04-02 DIAGNOSIS — Z13220 Encounter for screening for lipoid disorders: Secondary | ICD-10-CM

## 2024-04-02 MED ORDER — ROSUVASTATIN CALCIUM 10 MG PO TABS: 10.0000 mg | ORAL_TABLET | Freq: Every day | ORAL | 3 refills | Status: AC

## 2024-04-02 NOTE — Patient Instructions (Signed)
 Medication Instructions:  Start Crestor 10 mg Daily   *If you need a refill on your cardiac medications before your next appointment, please call your pharmacy*  Lab Work: Your physician recommends that you return for lab work in: 3 Weeks   If you have labs (blood work) drawn today and your tests are completely normal, you will receive your results only by: [employer] (if you have MyChart) OR A paper copy in the mail If you have any lab test that is abnormal or we need to change your treatment, we will call you to review the results.  Testing/Procedures: NONE   Follow-Up: At [facility], you and your health needs are our priority.  As part of our continuing mission to provide you with exceptional heart care, our providers are all part of one team.  This team includes your primary Cardiologist (physician) and Advanced Practice Providers or APPs (Physician Assistants and Nurse Practitioners) who all work together to provide you with the care you need, when you need it.  Your next appointment:    Spring 2026   Provider:   You may see Vina Gull, MD or one of the following Advanced Practice Providers on your designated Care Team:   Laymon Qua, PA-C  [place], [place] Olivia Pavy, [place]     We recommend signing up for the patient portal called MyChart.  Sign up information is provided on this After Visit Summary.  MyChart is used to connect with patients for Virtual Visits (Telemedicine).  Patients are able to view lab/test results, encounter notes, upcoming appointments, etc.  Non-urgent messages can be sent to your provider as well.   To learn more about what you can do with MyChart, go to [email or web address].   Other Instructions Thank you for choosing  [facility]!

## 2024-04-02 NOTE — Progress Notes (Unsigned)
 Cardiology Office Note   Date:  04/02/2024   ID:  Linda Kim, Linda Kim 1961/09/27, MRN [id number]  PCP:  Rosamond Leta NOVAK, MD  Cardiologist:   Vina Gull, MD   PT referred for cardiac evaluation after Ca score     History of Present Illness: Linda Kim is a 63 y.o. female with a history of hypothyroidism, osteoporsis   The pt recently had a Ca score CT   Ca score was 66.6 (LAD only; 79th percentile)   The pt says she  has had a couple episodes of Chest pain    ONe while hustling to get to work   Had some chst tightness  Eased  Pt had one other spell over 1 year ago Since then she has had no further spells  She is very active  Walks up / down flights of stairs all the time   Active   Works [employer] on treadmill regularly   Diet  Trying to improve Breakfast  Protein shake  Fairlife chocolate smoothi    Fruit (watermellon) Lunch  Left overs   Dinner:Protein and veggies LIquids   Water, diet root beer,   Current Meds  Medication Sig   calcium carbonate (OS-CAL - DOSED IN MG OF ELEMENTAL CALCIUM) 1250 (500 Ca) MG tablet Take 1 tablet by mouth.   cholecalciferol (VITAMIN D3) 25 MCG (1000 UT) tablet Take 1,000 Units by mouth daily.   denosumab (PROLIA) 60 MG/ML SOSY injection Inject 60 mg into the skin every 6 (six) months.   levothyroxine (SYNTHROID) 50 MCG tablet Take 50 mcg by mouth daily before breakfast.     Allergies:   Fosamax [alendronate]   Past Medical History:  Diagnosis Date   Osteopetrosis    Thyroid disease     No past surgical history on file.   Social History:  The patient  reports that she has never smoked. She has never used smokeless tobacco. She reports that she does not drink alcohol and does not use drugs.   Family History:  The patient's family history includes Breast cancer in her maternal aunt; Healthy in her mother.    ROS:  Please see the history of present illness. All other systems are reviewed and  Negative to the above problem except as noted.     PHYSICAL EXAM: VS:  BP 114/62 (BP Location: Left Arm)   Pulse 62   Ht 5' 1 (1.549 m)   Wt 141 lb 6.4 oz (64.1 kg)   SpO2 96%   BMI 26.72 kg/m   GEN: Well nourished, well developed, in no acute distress  HEENT: normal  Neck: no JVD, carotid bruits Cardiac: RRR; no murmurs   NO LE edema  Respiratory:  clear to auscultation  GI: soft, nontender, no masses  No hepatomegaly  MS: no deformity Moving all extremities     EKG:  EKG is ordered today.  NSR 62 bpm    Lipid Panel No results found for: CHOL, TRIG, HDL, CHOLHDL, VLDL, LDLCALC, LDLDIRECT    Wt Readings from Last 3 Encounters:  04/02/24 141 lb 6.4 oz (64.1 kg)      ASSESSMENT AND PLAN:  1  CAD  Pt with recent CA score that was 66. All in LAD  She has had rare CP that I do not think represents angina     Very active    Would recomm risk factor modification.   2  HL  LDL 123  HDL 57   Trig 118  Will start on Crestor    Follow up lipome, Apo B, Lpa in 8 wks with liver panel  3  Metabolics   Will check A1C in 8 wks   Reviewed diet  Stay active    Current medicines are reviewed at length with the patient today.  The patient does not have concerns regarding medicines.  Signed, Vina Gull, MD  04/02/2024 11:54 AM    [facility] [address] [place], [place], [place]  [address] Phone: [phone]; Fax: [phone]

## 2024-04-12 ENCOUNTER — Encounter: Payer: Self-pay | Admitting: Internal Medicine

## 2024-06-10 ENCOUNTER — Ambulatory Visit: Payer: Self-pay | Admitting: Internal Medicine

## 2024-06-10 LAB — NMR, LIPOPROFILE
Cholesterol, Total: 153 mg/dL (ref 100–199)
HDL Particle Number: 46.4 umol/L (ref >=30.5)
HDL-C: 66 mg/dL (ref >39)
LDL Particle Number: 743 nmol/L (ref <1000)
LDL Size: 20 nm — ABNORMAL LOW (ref >20.5)
LDL-C (NIH Calc): 66 mg/dL (ref 0–99)
LP-IR Score: 61 — ABNORMAL HIGH (ref <=45)
Small LDL Particle Number: 382 nmol/L (ref <=527)
Triglycerides: 122 mg/dL (ref 0–149)

## 2024-06-10 LAB — HEMOGLOBIN A1C
Est. average glucose Bld gHb Est-mCnc: 114 mg/dL
Hgb A1c MFr Bld: 5.6 % (ref 4.8–5.6)

## 2024-06-10 LAB — HEPATIC FUNCTION PANEL
ALT: 18 IU/L (ref 0–32)
AST: 20 IU/L (ref 0–40)
Albumin: 4.2 g/dL (ref 3.9–4.9)
Alkaline Phosphatase: 64 IU/L (ref 44–121)
Bilirubin Total: 0.3 mg/dL (ref 0.0–1.2)
Bilirubin, Direct: 0.11 mg/dL (ref 0.00–0.40)
Total Protein: 6.3 g/dL (ref 6.0–8.5)

## 2024-06-10 LAB — LIPOPROTEIN A (LPA): Lipoprotein (a): 116.7 nmol/L — ABNORMAL HIGH

## 2024-10-27 ENCOUNTER — Other Ambulatory Visit: Payer: Self-pay | Admitting: Obstetrics and Gynecology

## 2024-10-27 DIAGNOSIS — Z1231 Encounter for screening mammogram for malignant neoplasm of breast: Secondary | ICD-10-CM

## 2024-11-09 ENCOUNTER — Encounter: Payer: Self-pay | Admitting: Internal Medicine

## 2024-12-15 ENCOUNTER — Ambulatory Visit

## 2024-12-17 ENCOUNTER — Ambulatory Visit: Admitting: Internal Medicine
